# Patient Record
Sex: Male | Born: 1950
Health system: Southern US, Community
[De-identification: ages and names within clinical notes are randomized; demographics above are authoritative.]

---

## 1998-12-18 ENCOUNTER — Ambulatory Visit (HOSPITAL_COMMUNITY): Admission: RE | Admit: 1998-12-18 | Discharge: 1998-12-18 | Payer: Self-pay | Admitting: Internal Medicine

## 1999-01-06 ENCOUNTER — Ambulatory Visit (HOSPITAL_COMMUNITY): Admission: RE | Admit: 1999-01-06 | Discharge: 1999-01-06 | Payer: Self-pay | Admitting: Internal Medicine

## 2001-01-23 ENCOUNTER — Encounter: Payer: Self-pay | Admitting: Orthopedic Surgery

## 2001-01-23 ENCOUNTER — Ambulatory Visit (HOSPITAL_COMMUNITY): Admission: RE | Admit: 2001-01-23 | Discharge: 2001-01-23 | Payer: Self-pay | Admitting: Orthopedic Surgery

## 2001-01-26 ENCOUNTER — Ambulatory Visit (HOSPITAL_BASED_OUTPATIENT_CLINIC_OR_DEPARTMENT_OTHER): Admission: RE | Admit: 2001-01-26 | Discharge: 2001-01-26 | Payer: Self-pay | Admitting: Orthopedic Surgery

## 2002-05-07 ENCOUNTER — Encounter: Admission: RE | Admit: 2002-05-07 | Discharge: 2002-08-05 | Payer: Self-pay | Admitting: Internal Medicine

## 2003-05-14 ENCOUNTER — Encounter: Payer: Self-pay | Admitting: Neurosurgery

## 2003-05-14 ENCOUNTER — Ambulatory Visit (HOSPITAL_COMMUNITY): Admission: RE | Admit: 2003-05-14 | Discharge: 2003-05-14 | Payer: Self-pay | Admitting: Neurosurgery

## 2004-12-24 ENCOUNTER — Ambulatory Visit (HOSPITAL_COMMUNITY): Admission: RE | Admit: 2004-12-24 | Discharge: 2004-12-24 | Payer: Self-pay | Admitting: Gastroenterology

## 2011-06-21 ENCOUNTER — Other Ambulatory Visit: Payer: Self-pay | Admitting: Occupational Medicine

## 2011-06-21 ENCOUNTER — Ambulatory Visit: Payer: Self-pay

## 2011-06-21 DIAGNOSIS — R52 Pain, unspecified: Secondary | ICD-10-CM

## 2012-06-03 IMAGING — CR DG FOOT COMPLETE 3+V*R*
3 series · 3 of 3 positions shown · non-contrast
Comparison: None.

CLINICAL DATA: Pain in the second and third toes after fall
yesterday.

RIGHT FOOT COMPLETE - 3+ VIEW

[view not recorded (1 of 3)]
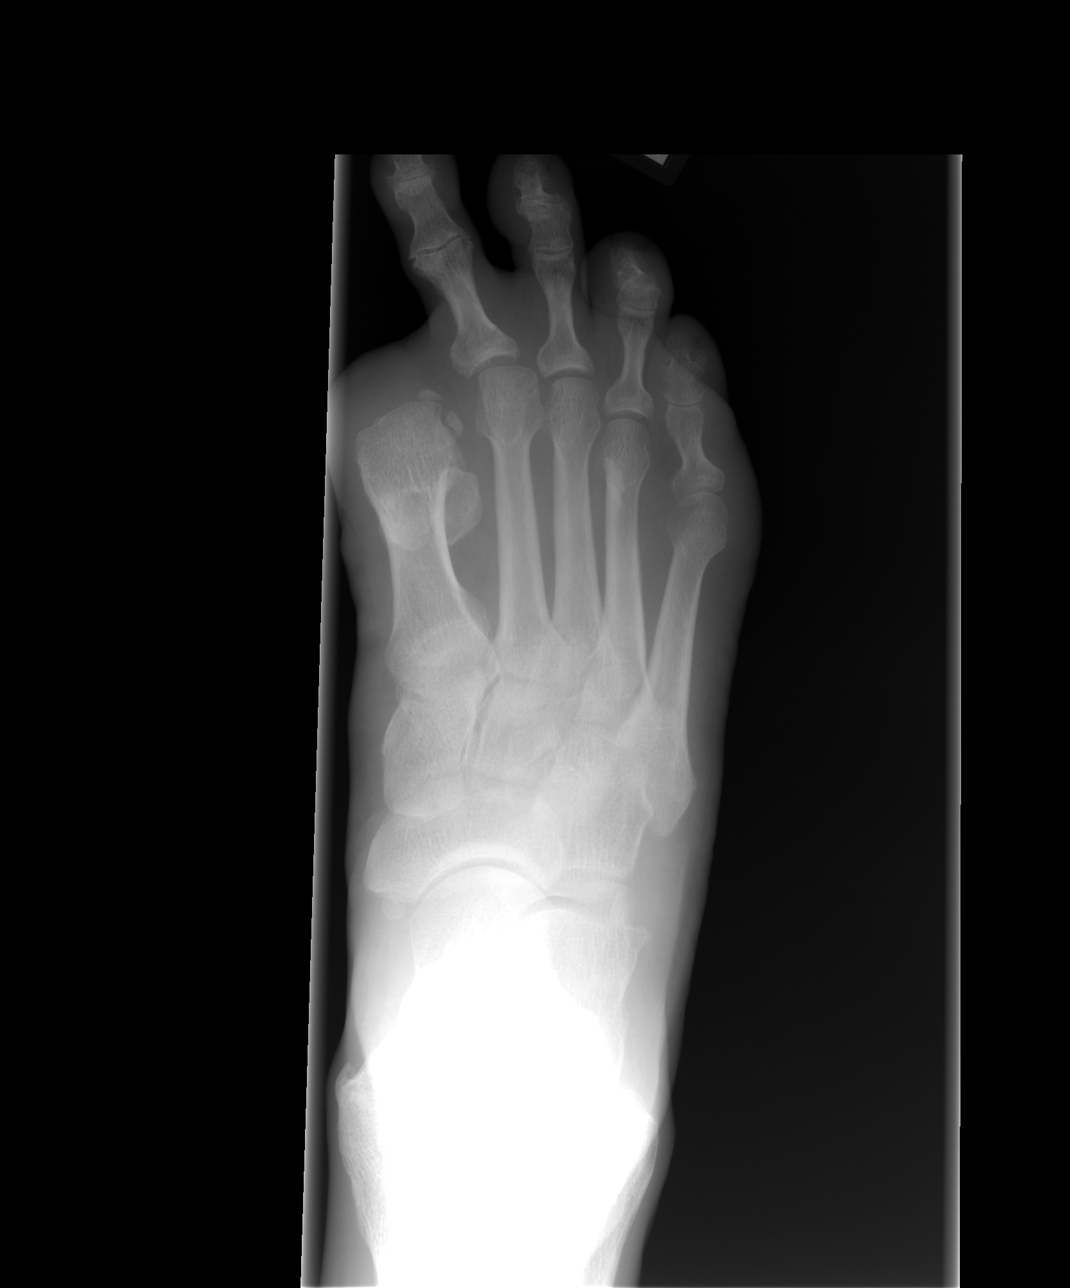

[view not recorded (2 of 3)]
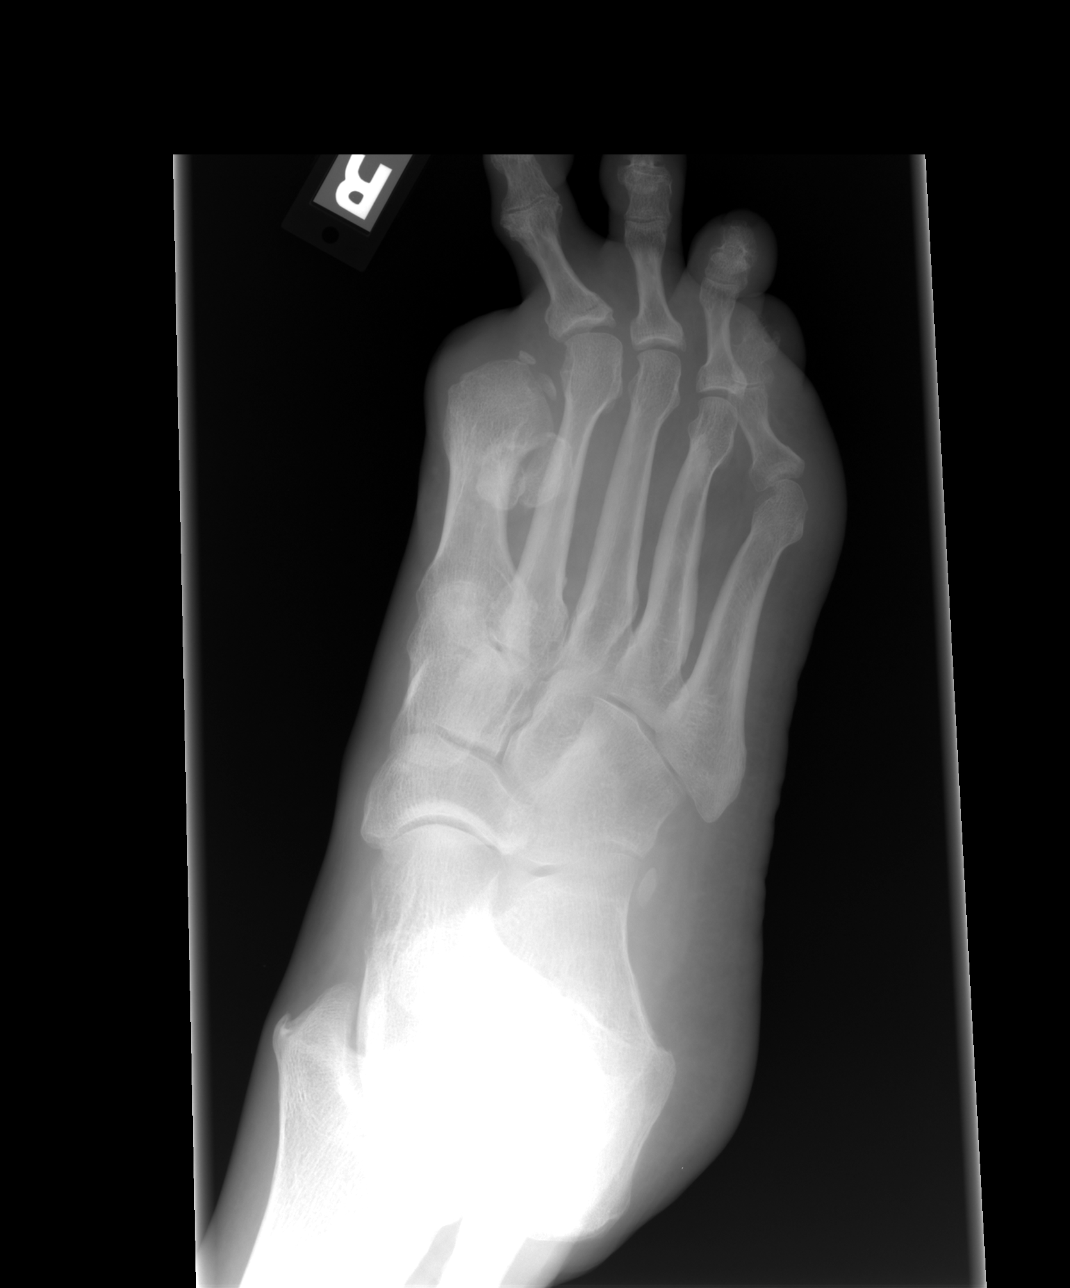

[view not recorded (3 of 3)]
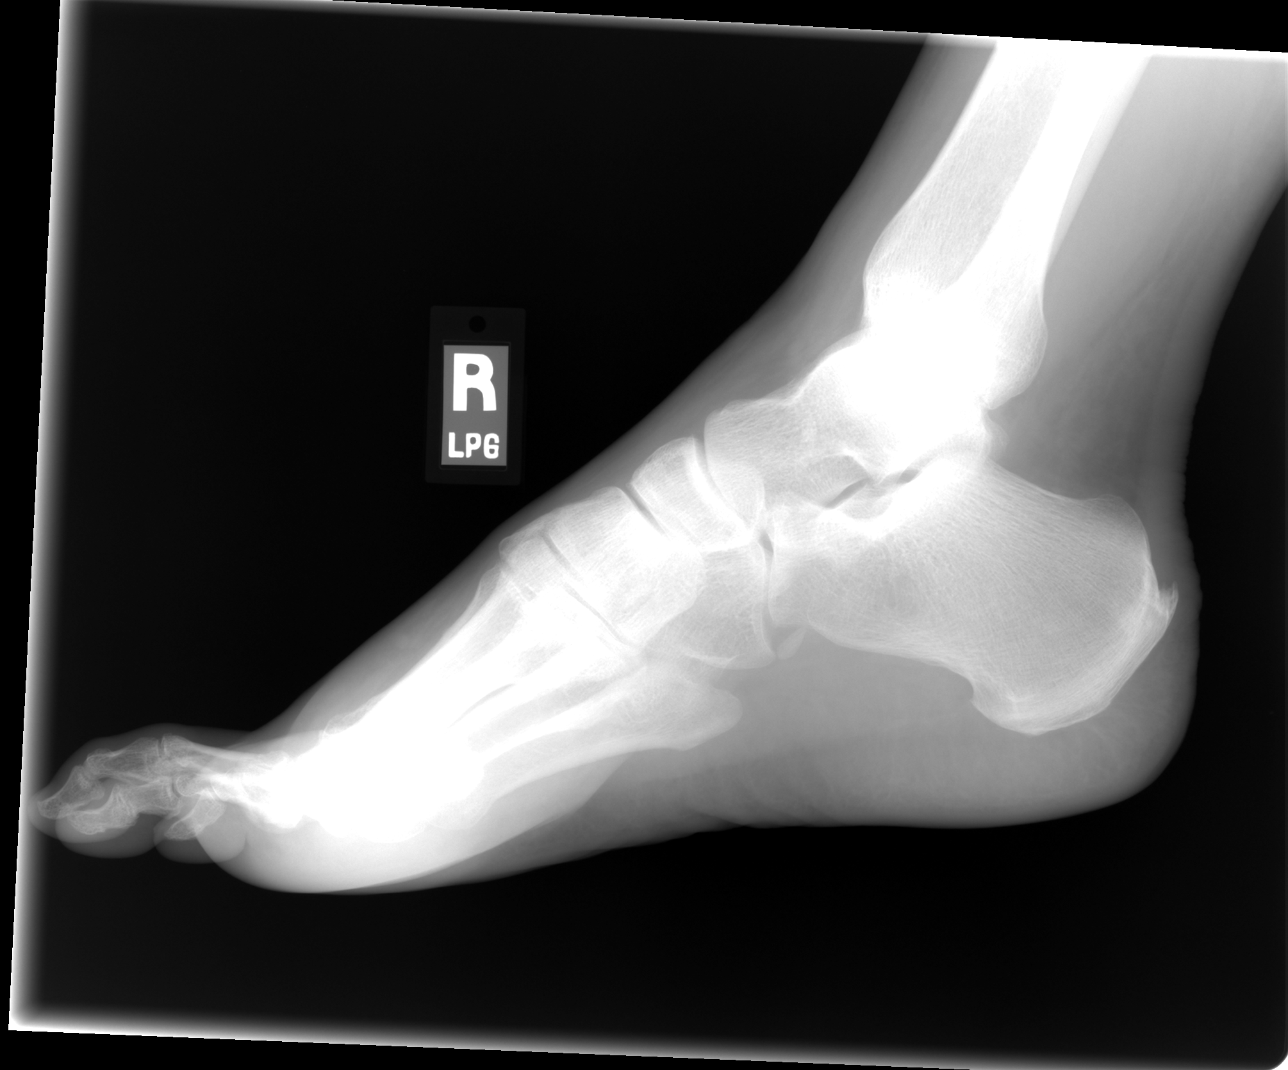

[3 of 3 positions shown; findings below may reference images not displayed]

FINDINGS: There is a nondisplaced fracture to the base of the
proximal phalangeal bone of the second toe.  No other acute
abnormalities.

 Arthritic changes are present in the DIP joints of the toes and in
the PIP joints of the second and third toes.

Right great toe has been amputated.  Irregularity of the head of
the first metatarsal.
IMPRESSION: Nondisplaced fracture of the base of the proximal phalangeal bone
of the second toe.

## 2013-01-02 ENCOUNTER — Ambulatory Visit (INDEPENDENT_AMBULATORY_CARE_PROVIDER_SITE_OTHER): Payer: Self-pay | Admitting: Family Medicine

## 2013-01-02 VITALS — BP 138/80 | Wt 216.0 lb

## 2013-01-02 DIAGNOSIS — E119 Type 2 diabetes mellitus without complications: Secondary | ICD-10-CM

## 2013-01-02 NOTE — Progress Notes (Signed)
Patient presents today for 3 mo DM follow-up as part of the employer-sponsored [employer] program. Current DM regimen includes Hmalog via pump and Metformin. Patient also continues on daily ASA and ACEi. Due for appt with Jacky Kindle in 2 weeks, labwork tomorrow. Patient saw Cathey in December, there were no changes at this time.   Diabetes Assessment: Uses glucometer; 7 day CBG average 177; 14 day CBG average 151; 30 day CBG average 156; Highest CBG 257; Lowest CBG 67 Other Diabetes History: Patient continues testing 1-4 times per day, which is less than optimal for type 1 diabetic. Patient understands that he should be testing at least 6 times daily and recognizes that he needs to work on this. On a positive note, patient's glucose averages have decreased by ~30 points. Most readings run in the 130-170 range. Patient has an occassional hi above 200 and an occassional low below 100. Pt states that his insulin use has increased and he is now calculating insulin requirement and adding a few units to this value. Pt will see Dr. Jacky Kindle in one week and I expect that at this time patient will have basal rates adjusted and potentially carb ratio adjusted.   MD identified a newly forming cataract, will monitor this closely. Will follow-up in 1 year. Cataract had not progressed over the past year.  Lifestyle Factors: Exercise - Continues exercising 3-4 times daily, using cardio-rider and walking the dog.   Diet - Patient is the primary cook and he does admit the need to sometimes prepare more healthy dishes. Wife wants to lose weight so they are working on this together, eating more vegetables and watching portion sizes. Patient is doing better with carb counting but still needs to work on this.   Assessment: We do not have an A1c for today, but it appears pts glucose has improved as all averages are ~30 points lower than previously. Patient continues to do well with exercise but has room for improvement in  diet, carb counting, and glucose monitoring. We have set goals for these things and pt will return in 3 months.  Plan: 1) Continue exercising regularly 2) Continue to make healthy dietary choices 3) Continue being diligent in counting carbs 4) Attempt to increase glucose testing 5) Follow-up in 3 months on Wednesday, May 21st @ 2:30 pm

## 2013-01-22 NOTE — Progress Notes (Signed)
ATTENDING PHYSICIAN NOTE: I have reviewed the chart and agree with the plan as detailed above. Sara Neal MD Pager [phone]  

## 2013-04-03 ENCOUNTER — Ambulatory Visit (INDEPENDENT_AMBULATORY_CARE_PROVIDER_SITE_OTHER): Payer: Self-pay | Admitting: Family Medicine

## 2013-04-03 VITALS — BP 128/74 | Wt 216.0 lb

## 2013-04-03 DIAGNOSIS — E119 Type 2 diabetes mellitus without complications: Secondary | ICD-10-CM

## 2013-04-03 NOTE — Progress Notes (Signed)
Patient presents today for 3 month diabetes follow-up as part of the employer-sponsored [employer] program. Current diabetes regimen includes Humalog via pump and Metformin. Patient also continues on daily ASA, and ACEi. Most recent MD follow-up was this past April with Edison Pace, CDE. Previous to that patient was seen by Dr. Jacky Kindle in March. Next follow-up will be in July. No med changes or major health changes at this time.   Diabetes Assessment: Type of Diabetes: Type 2; Sees Diabetes provider 4 or more times per year; MD managing Diabetes Edison Pace, CDE, Geoffry Paradise, Endo; takes medications as prescribed; takes an aspirin a day; hypoglycemia frequency Rare; checks feet daily; Lowest CBG 69; Highest CBG 291,188; checks blood glucose 3-4 times a day Other Diabetes History: Current med regimen includes Humalog via pump and Metformin 1000 mg twice daily. Dr. Jacky Kindle recently adjusted insulin pump settings and patient is pleased with improved control after these adjustments. Patient does maintain good medication compliance. Patient did bring meter today and is currently testing 1-4 times per day. Glucose monitoring occurs fasting/pre-prandial/post-prandial/bedtime/when symptomatic. Hypoglycemia frequency is rare. Patient does demonstrate appropriate correction of hypoglycemia. Patient reports continued symptoms of neuropathy including tingling in toes and sometimes lower extremeties. Patient is able to tell by neuropathy symptoms if glucose is being well controlled or not. Patient is not due for yearly eye exam.  Lifestyle Factors: Exercise - Continues exercising 3-4 times daily, walking the dog and working in the yard. He is no longer using the cardio rider due to lack of time. Pt owns a vacation home in [place] and will be spending a lot of time there. He and his wife are able to stay active while on vacation and enjoy walking their neighborhood and beach and boating.  Diet - Patient  attempts to maintain a healthy diet. He continues watching portion sizes and is attempting to eat more vegetables and non-starchy foods. Patient continues carb counting prior to each meal.   Assessment: We do not have an A1c for today, but it appears pts glucose has improved as glucose range is tighter than previously has been. Patient continues to do well with exercise but has room for improvement in diet. Patient is very aware of the need for tight diabetes control in order to prevent complications. He will attempt to maintain healthy lifestyle and will follow-up in 3 months.   Plan: 1) Continue to maintain a healthy diet limiting portion sizes and making healthy choices 2) Continue to remain physically active 3) Continue testing regularly 4) Follow-up in 3 months on Tuesday August 26th @ 2:30 pm

## 2013-05-06 NOTE — Progress Notes (Signed)
Patient ID: Nathan Ortiz, male   DOB: Dec 11, 1950, 62 y.o.   MRN: [id number] ATTENDING PHYSICIAN NOTE: I have reviewed the chart and agree with the plan as detailed above. Denny Levy MD Pager [phone]

## 2013-07-09 ENCOUNTER — Ambulatory Visit (INDEPENDENT_AMBULATORY_CARE_PROVIDER_SITE_OTHER): Payer: 59 | Admitting: Family Medicine

## 2013-07-09 VITALS — BP 160/82 | Wt 219.0 lb

## 2013-07-09 DIAGNOSIS — E119 Type 2 diabetes mellitus without complications: Secondary | ICD-10-CM

## 2013-07-09 NOTE — Progress Notes (Signed)
Patient presents today for 3 month diabetes follow-up as part of the employer-sponsored [employer] to [employer] program. Current diabetes regimen includes Humalog and Metformin. Patient also continues on daily ASA and ACEi. Most recent MD follow-up was with Dr. Jacky Kindle this past July. Patient has a pending appt for tomorrow for follow-up with CDE, Edison Pace. No med changes or major health changes at this time.   Diabetes Assessment: Type of Diabetes: Type 2; Sees Diabetes provider 4 or more times per year; MD managing Diabetes Edison Pace, CDE, Geoffry Paradise, Endo; takes medications as prescribed; takes an aspirin a day; hypoglycemia frequency Rare; checks feet daily; uses glucometer; checks blood glucose 3-4 times a day; 30 day CBG average 165; 14 day CBG average 167; 7 day CBG average 173; Highest CBG 247; Lowest CBG 89; Other Diabetes History: Current med regimen includes Humalog via pump and Metformin 1000 mg twice daily. Dr. Jacky Kindle recently adjusted insulin pump settings and patient is pleased with improved control after these adjustments. Patient does maintain good medication compliance and uses about 48-50 units daily. Patient did bring meter today and is currently testing 1-4 times per day. Glucose monitoring occurs fasting/pre-prandial/post-prandial/bedtime/when symptomatic. Hypoglycemia frequency is rare. Patient does demonstrate appropriate correction of hypoglycemia. Patient reports continued symptoms of neuropathy including tingling in toes and sometimes lower extremeties, but reports this is stable and has not worsened. Patient is not due for yearly eye exam.  Lifestyle Assessment: Exercise - Continues exercising 3-4 times per week, walking the dog and working in the yard. He is no longer using the cardio rider. Pt owns a vacation home in [place] and has spent a lot of time there this summer. He and his wife are able to stay active while on vacation and enjoy walking their neighborhood and  beach and boating.  Diet - No major changes to diet. Patient attempts to maintain a healthy diet. He continues watching portion sizes and is attempting to eat more vegetables and non-starchy foods. Patient continues carb counting prior to each meal.   Assessment: We do not have an A1c for today, but it appears pts glucose has improved as glucose range is tighter than previously has been. Patient continues to do well with exercise but has room for improvement in diet. Patient is very aware of the need for tight diabetes control in order to prevent complications. He will attempt to maintain healthy lifestyle and will follow-up in 3 months.   Plan: 1) Continue testing regularly 2) Continue making healthy dietary choices and being diligent about carb counting 3) Continue to stay physically active, and attempt to increase this as weather improves throughout fall 4) Follow-up in 3 months on Tuesday November 25th @ 2:30 pm

## 2013-09-19 ENCOUNTER — Other Ambulatory Visit: Payer: Self-pay

## 2013-10-08 ENCOUNTER — Ambulatory Visit (INDEPENDENT_AMBULATORY_CARE_PROVIDER_SITE_OTHER): Payer: 59 | Admitting: Family Medicine

## 2013-10-08 VITALS — BP 142/72 | Wt 218.0 lb

## 2013-10-08 DIAGNOSIS — E119 Type 2 diabetes mellitus without complications: Secondary | ICD-10-CM

## 2013-10-08 NOTE — Progress Notes (Signed)
Nathan Ortiz presents today for 3 month diabetes follow-up as part of the employer-sponsored [employer] program. Current diabetes regimen includes Humalog via pump and Metformin. Nathan Ortiz also continues on daily ASA, ACEi, and statin. Most recent MD follow-up with Dr. Jacky Kindle was late October 2014. Nathan Ortiz has a pending appt with Cathey CDE in early December and Dr. Jacky Kindle in February. No med changes or major health changes at this time.   Diabetes Assessment: Type of Diabetes: Type 2; Sees Diabetes provider 4 or more times per year; MD managing Diabetes Edison Pace, CDE, Geoffry Paradise, Endo; takes medications as prescribed; takes an aspirin a day; hypoglycemia frequency Rare; checks feet daily; uses glucometer; checks blood glucose 3-4 times a day; 30 day CBG average 165; 14 day CBG average 167; 7 day CBG average 173; Lowest CBG 69; Highest CBG 289; Other Diabetes History: Current med regimen includes Humalog via pump and Metformin 1000 mg twice daily. Dr. Jacky Kindle recently adjusted insulin pump settings and Nathan Ortiz is pleased with improved control after these adjustments. Early AM basal rate was increased to 2.8 units per hour. Nathan Ortiz does maintain good medication compliance and uses about 50 units daily, sometimes more. Nathan Ortiz did bring meter today and is currently testing 1-4 times per day. Uses two meters now, so readings are spread out among those two meters. One he uses as primary and the other his wife keeps in her purse for use while they are out. Glucose monitoring occurs fasting/pre-prandial/post-prandial/bedtime/when symptomatic. Hypoglycemia frequency is rare. Nathan Ortiz did have one reading as low as 69 this month. Nathan Ortiz does demonstrate appropriate correction of hypoglycemia. Nathan Ortiz reports continued symptoms of neuropathy including tingling in toes and sometimes lower extremeties, but reports this is stable and has not worsened. Nathan Ortiz is not due for yearly eye exam.  Lifestyle  Factors: Exercise - Continues exercising 3-4 times per week, walking the dog and working in the yard. He is no longer using the cardio rider. Pt owns a vacation home in [place] and when there he does spend a lot of time walking and working on home improvements. Nathan Ortiz now has function on cell phone to track distance walked and heart rate, etc. Tracks between 8000-9000 steps per day. Nathan Ortiz is aware of 10,000 step/day goal.  Diet - No major changes to diet. Nathan Ortiz attempts to maintain a healthy diet. He continues watching portion sizes and is attempting to eat more vegetables and non-starchy foods. Nathan Ortiz continues carb counting prior to each meal. Patients wife is attempting to lose weight and they both are following somewhat of a weight watcher diet, although they are not counting points. Instead they are focusing on dietary choices and portion sizes.  Assessment: Nathan Ortiz presents today with a slightly improved A1c of 7.2 (vs 7.4). Nathan Ortiz continues to do well with diet and exercise but has room for improvement. Nathan Ortiz is very aware of the need for tight diabetes control in order to prevent complications. He will attempt to maintain healthy lifestyle throughout the holidays and will follow-up in 3 months.   Plan: 1) Continue to maintain healthy diet and portion sizes during the holidays 2) Continue exercising and attmept to reach 10,000 steps per day 3) Continue testing regularly 4) Follow-up in 3 months on Tuesday March 3rd @ 2:30 pm

## 2013-11-25 NOTE — Progress Notes (Signed)
Patient ID: Nathan Ortiz, male   DOB: 10/06/1951, 62 y.o.   MRN: [id number] ATTENDING PHYSICIAN NOTE: I have reviewed the chart and agree with the plan as detailed above. Sara Neal MD Pager [phone]  

## 2013-11-25 NOTE — Progress Notes (Signed)
Patient ID: Ledon SnareMatthew J Fillion, male   DOB: Feb 09, 1951, 63 y.o.   MRN: [id number] ATTENDING PHYSICIAN NOTE: I have reviewed the chart and agree with the plan as detailed above. Denny LevySara Rye Dorado MD Pager [phone]

## 2014-01-22 ENCOUNTER — Ambulatory Visit (INDEPENDENT_AMBULATORY_CARE_PROVIDER_SITE_OTHER): Payer: 59 | Admitting: Family Medicine

## 2014-01-22 VITALS — BP 104/62 | Wt 216.0 lb

## 2014-01-22 DIAGNOSIS — E119 Type 2 diabetes mellitus without complications: Secondary | ICD-10-CM

## 2014-01-22 NOTE — Progress Notes (Signed)
Patient presents today for 3 month diabetes follow-up as part of the employer-sponsored [employer] to [employer] program. Current diabetes regimen includes Humalog and Xigduo (dapagliflozin and metformin). Patient also continues on daily ASA and ACEi. Most recent MD follow-up was last week, March 2015 with [name]. Patient has a pending appt for May 2015 with Cathey, CDE. No med changes or major health changes at this time. A1c 7.2 at last appt.   Diabetes Assessment: Type of Diabetes: Type 2; Sees Diabetes provider 4 or more times per year; MD managing Diabetes [name], CDE, Geoffry Paradiseichard Aronson, Endo; takes medications as prescribed; takes an aspirin a day; hypoglycemia frequency Rare; checks feet daily; uses glucometer; checks blood glucose 3-4 times a day; 7 day CBG average 177; 14 day CBG average 170; 30 day CBG average 160; Highest CBG 252; Lowest CBG 70; A1c 7.2 Other Diabetes History: Current med regimen includes Humalog via pump and Xigduo 5/1,000 mg twice daily (dapagliflozin and metformin). Patient was started on xigduo in December. Thus far, he has not had any improvement in A1c. Patient is tolerating new med well but does report some diuresis. No pump adjustments at last visit with [name]. Patient does maintain good medication compliance and uses about 50 units daily, sometimes more. Patient did bring meter today and is currently testing 1-4 times per day. Continues using two meters, so readings are spread out among those two meters. One he uses as primary and the other his wife keeps in her purse for use while they are out. Glucose monitoring occurs fasting/pre-prandial/post-prandial/bedtime/when symptomatic. Hypoglycemia frequency is rare. Patient does demonstrate appropriate correction of hypoglycemia. Patient reports continued symptoms of neuropathy including tingling in toes and sometimes lower extremeties, but reports this is stable and has not worsened. Patient had yearly eye exam in  January revealing small cataracts (no action needed) and negative for retinopathy. Of note, patient is missing his great toe of right foot due to injury approxmately 35 years ago. He reports a neuroma on this joint which sometimes causes discomfort.   Lifestyle Factors: Exercise - Patient cotninues wearing pedometer and is logging 8000-9000 steps per day. He has not been able to acheive goal of 10,000 steps per day. I have encouraged him to walk in the evenings in order to reach goal. Pt owns a vacation home in [place] and when there he does spend a lot of time walking and working on home improvements.  Diet - No major changes to diet. Patient attempts to maintain a healthy diet. He continues watching portion sizes and is attempting to eat more vegetables and non-starchy foods. Patient continues carb counting prior to each meal. Patients wife is attempting to lose weight and they both are following somewhat of a weight watcher diet, although they are not counting points. Instead they are focusing on dietary choices and portion sizes.   Assessment: Patient presents today with no change in A1c at 7.2. Patient continues to do well with diet and exercise but has room for improvement. Patient is very aware of the need for tight diabetes control in order to prevent complications. He will attempt to maintain healthy lifestyle and will follow-up in 3 months.   Plan: 1) Continue making healthy dietary choices and watching portion sizes 2) Continue walking and staying as active as possible. Goal 10,000 steps per day 3) Continue testing regularly 4) Follow-up in 3 months on Wednesday June 10th @ 2:30

## 2014-04-23 ENCOUNTER — Ambulatory Visit (INDEPENDENT_AMBULATORY_CARE_PROVIDER_SITE_OTHER): Payer: 59 | Admitting: Family Medicine

## 2014-04-23 VITALS — BP 130/62 | Wt 215.0 lb

## 2014-04-23 DIAGNOSIS — E119 Type 2 diabetes mellitus without complications: Secondary | ICD-10-CM

## 2014-04-25 NOTE — Progress Notes (Signed)
Patient presents today for 3 month diabetes follow-up as part of the employer-sponsored [employer] to [employer] program. Current diabetes regimen includes Xigduo (dapagliflozin and metformin) and humalog via pump. Patient also continues on daily ASA and ACEi. Most recent MD follow-up was with [name] CDE in May 2015. Patient has a pending appt for July with [name], endo. He continues alternating between endo and CDE. No med changes or major health changes at this time.  Diabetes Assessment: Type of Diabetes: Type 2; Sees Diabetes provider 4 or more times per year; MD managing Diabetes [name], CDE, Geoffry Paradiseichard Aronson, Endo; takes medications as prescribed; takes an aspirin a day; hypoglycemia frequency Rare; checks feet daily; uses glucometer; checks blood glucose 3-4 times a day; 30 day CBG average 132; 14 day CBG average 139; 7 day CBG average 134; Highest CBG 291; Lowest CBG 76; Other Diabetes History: Current med regimen includes Humalog via pump and Xigduo 5/1,000 mg twice daily (dapagliflozin and metformin). Pump rates are being adjusted as needed by [name] and [name]. Patient does maintain good medication compliance and uses about 50 units daily, sometimes more. Patient did bring meter today and is currently testing 1-4 times per day, testing has increased since last visit. Continues using two meters, so readings are spread out among those two meters. One he uses as primary and the other his wife keeps in her purse for use while they are out. Glucose monitoring occurs fasting/pre-prandial/post-prandial/bedtime/when symptomatic. Hypoglycemia frequency is rare. Glucose averages for 7, 14, and 30 days are all down by 30-40 points. Patient does demonstrate appropriate correction of hypoglycemia. Patient reports continued symptoms of neuropathy including tingling in toes and sometimes lower extremeties, but reports this is stable and has not worsened. Patient had yearly eye exam in January  revealing small cataracts (no action needed) and negative for retinopathy. Of note, patient is missing his great toe of right foot due to injury approxmately 35 years ago. He reports a neuroma on this joint which sometimes causes discomfort. A1c at most recent MD appt (May) had decreased significantly to 6.6 (previously 7.2).   Lifestyle Factors: Exercise - Patient continues using a pedometer function on phone, he is now tracking minutes walked vs steps walked. He has a goal of walking 60 minutes total per day, and some days he is walking as much as 80-90 min per day. In addition to walking at work, patient and his wife are also doing much yard work this summer. They also plan to use their vacation home several times this summer and are often more active there.  Diet - No major changes to diet. Patient attempts to maintain a healthy diet. He continues watching portion sizes and is attempting to eat more vegetables and non-starchy foods. Patient continues carb counting prior to each meal. Patients wife continues to work on weight loss and they both are trying to make better food choices.  Assessment: Patient presents today with improved A1cof 6.6 (prev 7.2). Patient continues to do well with diet and exercise but has room for improvement. Patient is very aware of the need for tight diabetes control in order to prevent complications. He will attempt to maintain healthy lifestyle and will follow-up in 3 months.  Plan: 1) Continue making healthy dietary choices 2) Continue to stay active 3) Continue to test glucose regularly 4) Follow-up in 3 months on Wednesday Sept 9th @ 2:30 pm

## 2014-06-17 NOTE — Progress Notes (Signed)
Patient ID: Nathan Ortiz, male   DOB: 02/21/1951, 62 y.o.   MRN: [id number] Reviewed: Agree with our pharmacologist's documentation and management. 

## 2014-06-17 NOTE — Progress Notes (Signed)
Patient ID: Nathan SnareMatthew J Sorber, male   DOB: May 25, 1951, 63 y.o.   MRN: [id number] Reviewed: Agree with our pharmacologist's documentation and management.

## 2014-06-18 ENCOUNTER — Encounter (HOSPITAL_BASED_OUTPATIENT_CLINIC_OR_DEPARTMENT_OTHER): Payer: 59 | Attending: General Surgery

## 2014-06-18 DIAGNOSIS — I10 Essential (primary) hypertension: Secondary | ICD-10-CM | POA: Diagnosis not present

## 2014-06-18 DIAGNOSIS — E1169 Type 2 diabetes mellitus with other specified complication: Secondary | ICD-10-CM | POA: Diagnosis present

## 2014-06-18 DIAGNOSIS — S98119A Complete traumatic amputation of unspecified great toe, initial encounter: Secondary | ICD-10-CM | POA: Insufficient documentation

## 2014-06-18 DIAGNOSIS — L97509 Non-pressure chronic ulcer of other part of unspecified foot with unspecified severity: Secondary | ICD-10-CM | POA: Diagnosis not present

## 2014-06-18 DIAGNOSIS — Z794 Long term (current) use of insulin: Secondary | ICD-10-CM | POA: Diagnosis not present

## 2014-06-18 NOTE — Progress Notes (Signed)
[facility]  NAME:  Sharol GivenWIERSMA, Demetre             ACCOUNT NO.:  [id number]  MEDICAL RECORD NO.:  [id number]      DATE OF BIRTH:  12/11/1950  PHYSICIAN:  Ardath SaxPeter Alphons Burgert, M.D.      VISIT DATE:  06/18/2014                                  OFFICE VISIT   This is a 63 year old gentleman who is a diabetic who came to us with 2 diabetic ulcers on his left great toe and left third toe.  This man has a history of hypertension and today his blood pressure is 129/67, pulse 92, temperature 99.  He weighs 215 pounds.  His medications for his diabetes and his hypertension are lisinopril 12.5 mg a day.  He takes Humalog 57 units a day.  He is also on aspirin and vitamins.  He also takes metformin.  He had a amputation of his right great toe in the past and these ulcers on his left foot are only 2 or 3 mm in diameter.  I am going to treat them with felt to offload and silver collagen.  He will come back here in a week, and we will check his progress.     Ardath SaxPeter Arwen Haseley, M.D.     PP/MEDQ  D:  06/18/2014  T:  06/18/2014  Job:  [id number]

## 2014-06-25 DIAGNOSIS — E1169 Type 2 diabetes mellitus with other specified complication: Secondary | ICD-10-CM | POA: Diagnosis not present

## 2014-07-02 DIAGNOSIS — E1169 Type 2 diabetes mellitus with other specified complication: Secondary | ICD-10-CM | POA: Diagnosis not present

## 2014-07-09 DIAGNOSIS — E1169 Type 2 diabetes mellitus with other specified complication: Secondary | ICD-10-CM | POA: Diagnosis not present

## 2014-07-16 ENCOUNTER — Encounter (HOSPITAL_BASED_OUTPATIENT_CLINIC_OR_DEPARTMENT_OTHER): Payer: 59 | Attending: General Surgery

## 2014-07-16 DIAGNOSIS — L97509 Non-pressure chronic ulcer of other part of unspecified foot with unspecified severity: Secondary | ICD-10-CM | POA: Insufficient documentation

## 2014-07-16 DIAGNOSIS — E1169 Type 2 diabetes mellitus with other specified complication: Secondary | ICD-10-CM | POA: Insufficient documentation

## 2014-07-23 ENCOUNTER — Ambulatory Visit (INDEPENDENT_AMBULATORY_CARE_PROVIDER_SITE_OTHER): Payer: Self-pay | Admitting: Family Medicine

## 2014-07-23 VITALS — BP 142/74 | Wt 215.0 lb

## 2014-07-23 DIAGNOSIS — E108 Type 1 diabetes mellitus with unspecified complications: Secondary | ICD-10-CM

## 2014-07-29 NOTE — Progress Notes (Signed)
Patient presents today for 3 month diabetes follow-up as part of the employer-sponsored [employer] to [facility] program. Current diabetes regimen includes Xigduo (dapagliflozin and metformin) and humalog via pump. Patient also continues on daily ASA and ACEi. Most recent MD follow-up was with Edison Pace CDE in Aug 2015. Patient will follow-up in 3 months. He continues alternating between endo and CDE. No med changes or major health changes at this time.  Diabetes Assessment: Type of Diabetes: Type 2; Sees Diabetes provider 4 or more times per year; MD managing Diabetes Edison Pace, CDE, Geoffry Paradise, Endo; takes medications as prescribed; takes an aspirin a day; hypoglycemia frequency Rare; checks feet daily; uses glucometer; checks blood glucose 3-4 times a day; 7 day CBG average 136; 14 day CBG average 134; 30 day CBG average 123; Lowest CBG 69; Highest CBG 174; Other Diabetes History: Current med regimen includes Humalog via pump and Xigduo 5/1,000 mg twice daily (dapagliflozin and metformin). Pump rates are being adjusted as needed by Dr. Jacky Kindle and Edison Pace. Patient does maintain good medication compliance and uses about 50 units daily, sometimes more. Patient did bring meter today and is currently testing 1-4 times per day. Continues using two meters, so readings are spread out among those two meters. Glucose monitoring occurs fasting/pre-prandial/post-prandial/bedtime/when symptomatic. Hypoglycemia frequency is rare. Glucose averages for 7, 14, and 30 days have improved. Patient does demonstrate appropriate correction of hypoglycemia. Patient reports continued symptoms of neuropathy including tingling in toes and sometimes lower extremeties, but reports this is stable and has not worsened. Patient recently had a slow healing foot injury that was treated by wound care and has now finally healed. The wound never became infected but was just slow healing. Wound care eventually patched the small wound  with a skin graft and this closed the wound completely. Patient had yearly eye exam in January revealing small cataracts (no action needed) and negative for retinopathy. Of note, patient is missing his great toe of right foot due to injury approxmately 35 years ago. He reports a neuroma on this joint which sometimes causes discomfort. A1c at most recent MD appt (May) had decreased to 6.2 (previously 6.6).   Lifestyle Factors: Exercise - Patient continues using a pedometer function on phone, he is now tracking minutes walked vs steps walked. He has a goal of walking 60 minutes total per day, and some days he is walking as much as 80-90 min per day. In addition to walking at work, patient continues with regular yard work. They also have a 2 week trip to [place] planned at which time they will do more walking.  Diet - No major changes to diet. Trying to eat more fish lately as a healthier option. Patient attempts to maintain a healthy diet. He continues watching portion sizes and is attempting to eat more vegetables and non-starchy foods. Patient continues carb counting prior to each meal. Patients wife continues to work on weight loss and they both are trying to make better food choices.  Assessment: Patient presents today with improved A1cof 6.2 (prev 6.6). Patient continues to do well with diet and exercise but has room for improvement. Patient is very aware of the need for tight diabetes control in order to prevent complications. He will attempt to maintain healthy lifestyle and will follow-up in 3 months.  Plan: 1) Continue making healthy dietary choices 2) Continue to stay active, increase as able 3) Continue to test glucose regularly 4) Follow-up in 3 months - call to schedule appt

## 2014-10-01 NOTE — Progress Notes (Signed)
Patient ID: Nathan SnareMatthew J Zurcher, male   DOB: Mar 12, 1951, 63 y.o.   MRN: [id number] Reviewed: Agree with the documentation and management of [facility].

## 2014-10-02 ENCOUNTER — Ambulatory Visit (INDEPENDENT_AMBULATORY_CARE_PROVIDER_SITE_OTHER): Payer: Self-pay | Admitting: Family Medicine

## 2014-10-02 VITALS — BP 132/74 | Wt 215.0 lb

## 2014-10-02 DIAGNOSIS — E108 Type 1 diabetes mellitus with unspecified complications: Secondary | ICD-10-CM

## 2014-10-02 NOTE — Progress Notes (Signed)
Patient presents today for 3 month diabetes follow-up as part of the employer-sponsored [employer] to Well[facility] program. Current diabetes regimen includes Xigduo (dapagliflozin and metformin) and humalog via pump. Patient also continues on daily ASA and ACEi. Most recent MD follow-up was with [name] in Nov 2015. Patient will follow-up in 3 months. He continues alternating between endo and CDE. No med changes or major health changes at this time.  Diabetes Assessment: Type of Diabetes: Type 1; Sees Diabetes provider 4 or more times per year; MD managing Diabetes [name], CDE, Geoffry Paradiseichard Aronson, Endo; takes medications as prescribed; takes an aspirin a day; hypoglycemia frequency Rare; checks feet daily; uses glucometer; checks blood glucose 3-4 times a day; 30 day CBG average 132; 14 day CBG average 119; 7 day CBG average 114; Highest CBG 196; Lowest CBG 75 Other Diabetes History: Current med regimen includes Humalog via pump and Xigduo 5/1,000 mg twice daily (dapagliflozin and metformin). Pump rates are being adjusted as needed by [name] and [name]. Of note, patient has a new prescription on hold for Invokana plus Metformin to use in place of Kombiglyze if it is no longer covered in January. Patient does maintain good medication compliance and uses about 50 units daily, sometimes more. Patient did bring meter today and is currently testing 1-4 times per day. Continues using two meters, so readings are spread out among those two meters. Glucose monitoring occurs fasting/pre-prandial/post-prandial/bedtime/when symptomatic. Hypoglycemia frequency is rare. Glucose averages for 7 and 14 days have both improved, and 30 days have increased slightly. Patient does demonstrate appropriate correction of hypoglycemia. Patient reports continued symptoms of neuropathy including tingling in toes and sometimes lower extremeties, but reports this is stable and has not worsened. Patient recently had a slow healing  foot injury that was treated by wound care and continues to be healed, he can no longer tell where the wound was previously. Patient had yearly eye exam in January revealing small cataracts (no action needed) and negative for retinopathy. Of note, patient is missing his great toe of right foot due to injury approxmately 35 years ago. He reports a neuroma on this joint which sometimes causes discomfort. A1c at most recent MD appt (Nov) had increased to 6.3 (previously 6.2).   Lifestyle Factors: Exercise - Patient continues using a pedometer function on phone, he is now tracking minutes walked vs steps walked. He has a goal of walking 60 minutes total per day, and some days he is walking as much as 80-90 min per day. In addition to walking at work, patient continues with regular yard work. [place] also travel to [place] frequently and are active there, walking, fishing, kayaking, etc.  Diet - No major changes to diet. Trying to eat more fish lately as a healthier option. Patient attempts to maintain a healthy diet. He continues watching portion sizes and is attempting to eat more vegetables and non-starchy foods. Patient continues carb counting prior to each meal. Patients wife continues to work on weight loss and they both are trying to make better food choices.   Plan: 1) Continue making healthy dietary choices, specifically during the holidays 2) Continue to stay active, increase as able 3) Continue to test glucose regularly 4) Follow-up in 3 months on Thursday Feb 18th @ 2:30 pm after Cathy's appt

## 2014-11-03 NOTE — Progress Notes (Signed)
Patient ID: Lonza J Meine, male   DOB: 04/05/1951, 63 y.o.   MRN: [id number] Reviewed: Agree with the documentation and management of our [place] Pharmacologist. 

## 2015-01-07 ENCOUNTER — Ambulatory Visit (INDEPENDENT_AMBULATORY_CARE_PROVIDER_SITE_OTHER): Payer: Self-pay | Admitting: Family Medicine

## 2015-01-07 VITALS — BP 122/66 | Ht 74.0 in | Wt 209.0 lb

## 2015-01-07 DIAGNOSIS — E108 Type 1 diabetes mellitus with unspecified complications: Secondary | ICD-10-CM

## 2015-01-07 NOTE — Progress Notes (Signed)
Patient presents today for 3 month diabetes follow-up as part of the employer-sponsored [employer] to Well[facility] program. Current diabetes regimen includes Xigduo (dapagliflozin and metformin) and humalog via pump. Patient also continues on daily ASA and ACEi. Most recent MD follow-up was with [name], CDE in Feb 2016. Patient will follow-up in 3 months, late May 2016. He continues alternating between endo and CDE. Patient has been given samples of Levitra and is trying this as he does not feel Cialis is as efficacious as it once was. A1c at recent visit was 6.5 (prev 6.2).   Diabetes Assessment: Type of Diabetes: Type 2; Sees Diabetes provider 4 or more times per year; MD managing Diabetes [name], CDE, Geoffry Paradiseichard Aronson, Endo; takes medications as prescribed; takes an aspirin a day; hypoglycemia frequency Rare; checks feet daily; uses glucometer; checks blood glucose 3-4 times a day; Highest CBG 270; Lowest CBG 90; Other Diabetes History: Current med regimen includes Humalog via pump and Xigduo 5/1,000 mg twice daily (dapagliflozin and metformin). Pump rates are being adjusted as needed by [name] and [name]. Of note, patient has a new prescription on hold for Invokana plus Metformin to use in place of Kombiglyze if it is no longer covered. I will investigate this. Patient does maintain good medication compliance and uses about 50 units daily, sometimes more. Patient did bring meter today and is currently testing 1-4 times per day. Continues using two meters, so readings are spread out among those two meters. Glucose monitoring occurs fasting/pre-prandial/post-prandial/bedtime/when symptomatic. Hypoglycemia frequency is rare. Patient does demonstrate appropriate correction of hypoglycemia. Patient reports continued symptoms of neuropathy including tingling in toes and sometimes lower extremeties, but reports this is stable and has not worsened. He notices worsening in these symptoms as glucose  worsens. Patient had yearly eye exam in January, cataracts remain small (no action needed) and remains negative for retinopathy. Of note, patient is missing his great toe of right foot due to injury approxmately 35 years ago. He reports a neuroma on this joint which sometimes causes discomfort. A1c at most recent MD appt (Nov) had increased to 6.5 (previously 6.3).   Lifestyle Factors: Exercise - Patient continues using a pedometer to track steps walked. Patient is tracking ~8000 steps per day on the job. He does not participate in routine physical activity outside of work. He is generally more active during the warmer seasons and does regular yard work and is active at his second [place] in [place].  Diet - No major changes to diet. Patient attempts to maintain a healthy diet. He continues watching portion sizes and is attempting to eat more vegetables and non-starchy foods. Patient continues carb counting prior to each meal. Patients wife continues to work on weight loss and they both are trying to make better food choices. Both are adhering to the "[employer] Lovers diet" which allows normal foods, but in smaller quantities and more frequent meals. Also encourages healthy snacking between meals, in an effort to keep metabolism higher.   Plan: 1) Continue making healthy dietary choices, specifically portion control 2) Continue to stay active, increase activity as able 3) Continue to test glucose regularly 4) Follow-up in 3 months on Wednesday June 1st @ 2:30 pm

## 2015-01-20 NOTE — Progress Notes (Signed)
Patient ID: Cerrone J Chamorro, male   DOB: 11/17/1950, 63 y.o.   MRN: [id number] ATTENDING PHYSICIAN NOTE: I have reviewed the chart and agree with the plan as detailed above. Kierston Plasencia MD Pager [phone]  

## 2015-04-15 ENCOUNTER — Ambulatory Visit: Payer: Self-pay | Admitting: Pharmacist

## 2015-04-15 ENCOUNTER — Ambulatory Visit (INDEPENDENT_AMBULATORY_CARE_PROVIDER_SITE_OTHER): Payer: Self-pay | Admitting: Family Medicine

## 2015-04-15 VITALS — BP 122/70 | Ht 74.0 in | Wt 212.0 lb

## 2015-04-15 DIAGNOSIS — E108 Type 1 diabetes mellitus with unspecified complications: Secondary | ICD-10-CM

## 2015-04-15 NOTE — Progress Notes (Signed)
Accucheck Spirit combo speaks with meter  Highest 272, 303 Lowest 74, 75  No site issues  Eye exam:  Nov 24 2014 Dental exam:  Several years  No wounds at this time, no worsening of neuropathy.  No changes in pump settings at last appt.     Subjective:  Patient is a 64 yo male with type 1/2 diabetes/pre-diabetes who presents today for 3 month follow-up as part of the employer-sponsored [facility] to Wellness program. Current diabetes regimen includes Humalog via pump, Invokana, and Metformin. Patient also continues on daily ASA and ACEi. Patient is not on a statin at this time.  Most recent MD follow-up was yesterday with [name], CDE. Patient has a pending appt for August with [name], and [facility]. No med changes or major health changes at this time.  Diabetes Assessment:  No changes to diabetes regimen at this time.  Patient does maintain good medication compliance. Most recent A1c was 6.9% which is exceeding goal of less than 6.5%.  A1c has been trending upward over the previous 6-9 months with recent A1c readings of 6.2 and 6.5.  Weight has increased by 3 lbs since last visit.  Patient did bring meter today and is currently testing 3-4 times per day. Glucose monitoring occurs fasting, prior to meals and as needed.  Hypoglycemia is occassional and as low as 70s. Patient does demonstrate appropriate correction of hypoglycemia.  Patient reports continued, but not worsened, signs and symptoms of neuropathy including numbness/tingling/burning.  No signs of infection at this time.  Patient is up to date on eye exam but not on dental exam.      Lifestyle Assessment:  Diet - Patient and his wife started Weight Watcher's 2 months ago.  He is not counting points but is eating weight watcher friendly foods including more fresh foods, fruits, and vegetables.  He does not wish to make any additional changes at this time.   Exercise - Pt is on his feet and walking 8 hours  per day on the job.  He also enjoys yardwork on evenings and weekends.  Pt [place] his [place] travel to [place] regularly and enjoy outdoor activities such as canoeing and [place]     Plan and Goals: 1)  Attempt to further reduce A1c to <6.5 2)  Continue to improve diet, monitor portion sizes and continue to make healthy dietary choices 3)  Continue to stay active throughout the summer months 4)  Follow-up in 3 months on Thursday August 15th @ 2:15 pm   Orlin HildingElizabeth Deidrick Rainey, PharmD [facility]  [phone]

## 2015-04-15 NOTE — Patient Instructions (Addendum)
1)  Attempt to further reduce A1c to <6.5 2)  Continue to improve diet, monitor portion sizes and continue to make healthy dietary choices 3)  Continue to stay active throughout the summer months 4)  Follow-up in 3 months on Thursday August 15th @ 2:15 pm  Great to see you today!

## 2015-04-21 NOTE — Progress Notes (Signed)
Patient ID: Nathan SnareMatthew J Ortiz, male   DOB: Jan 19, 1951, 64 y.o.   MRN: [id number] ATTENDING PHYSICIAN NOTE: I have reviewed the chart and agree with the plan as detailed above. Denny LevySara Nayla Dias MD Pager [phone]

## 2015-07-09 ENCOUNTER — Encounter: Payer: Self-pay | Admitting: Pharmacist

## 2015-07-09 ENCOUNTER — Ambulatory Visit: Payer: Self-pay | Admitting: Pharmacist

## 2015-07-09 ENCOUNTER — Ambulatory Visit (INDEPENDENT_AMBULATORY_CARE_PROVIDER_SITE_OTHER): Payer: 59 | Admitting: Family Medicine

## 2015-07-09 VITALS — BP 138/64 | Wt 213.0 lb

## 2015-07-09 DIAGNOSIS — E108 Type 1 diabetes mellitus with unspecified complications: Secondary | ICD-10-CM

## 2015-07-09 NOTE — Patient Instructions (Signed)
1)  Good job with further reduced A1c of 6.8% 2)  Continue to improve diet, monitor portion sizes and continue to make healthy dietary choices 3)  Continue to stay active, attempt to use exercise facility at least once weekly 4)  Follow-up in 3 months on Wednesday November 30th @ 2:15 pm  Great to see you today!

## 2015-07-09 NOTE — Progress Notes (Signed)
Subjective:  Patient is a 64 yo male with type 1 diabetes who presents today for 3 month follow-up as part of the employer-sponsored [employer] to Wellness program. Current diabetes regimen includes Humalog via pump, Invokana, and Metformin. Patient also continues on daily ASA and ACEi. Patient is not on a statin at this time due to intolerance.  Most recent MD follow-up was yesterday with Dr. Jacky Kindle for yearly physical.  Three month follow-up with  Edison Pace, CDE in Nov, will make pump setting changes at this time if needed.  No med changes or major health changes at this time.   Diabetes Assessment:  A1c 6.8 (prev 6.9) Chol - TG-85, TC-147, HDL-31, LDL-99, CHOL/HDL 4.7, LDL/HDL 3.2 TSH normal LFTs - normal SCr-normal  No changes to diabetes regimen at this time.  Patient does maintain good medication compliance. Most recent A1c was 6.8% (prev 6.9%) which is exceeding goal of less than 6.5%. Weight has remained unchanged since last visit.  Patient did bring meter today and is currently testing 1-3 times per day, which is less than what he has tested in the past. Glucose monitoring occurs fasting, prior to meals and as needed.  Hypoglycemia is occassional and as low as 70s. Patient does demonstrate appropriate correction of hypoglycemia.  Patient reports continued, but not worsened, signs and symptoms of neuropathy including numbness/tingling/burning.  Some darkening of skin on lower legs, no change to toes, but does have increased tingling and numbness in toes when glucose is above goal.  No signs of infection at this time.  Patient is up to date on eye exam but not on dental exam.      Lifestyle Assessment:  Diet - Patient and his wife continue using Weight Watcher's.  He is not counting points but is eating weight watcher friendly foods including more fresh foods, fruits, and vegetables.  He does not wish to make any additional changes at this time.   Exercise - Pt is on his feet and walking 8  hours per day on the job.  Pt and his wife travel to [place] regularly and enjoy outdoor activities such as canoeing and walking their neighborhood and [place]. He is attempting to stay active and tracks steps using smartphone app.  He has recently moved to an apartment complex and will no longer be doing yardwork for exercise.  In exchange he and his wife would like to start using the apartment complex fitness facility at least once weekly.    Plan and Goals: 1)  Attempt to further reduce A1c to <6.5 2)  Continue to improve diet, monitor portion sizes and continue to make healthy dietary choices 3)  Continue to stay active throughout the summer months 4)  Follow-up in 3 months on Wednesday November 30th @ 2:15 pm  Great to see you today!  Orlin Hilding, PharmD Link to [facility]  [phone]

## 2015-07-22 NOTE — Progress Notes (Signed)
ATTENDING PHYSICIAN NOTE: I have reviewed the chart and agree with the plan as detailed above. Artrice Kraker MD Pager [phone]  

## 2015-10-14 ENCOUNTER — Ambulatory Visit (INDEPENDENT_AMBULATORY_CARE_PROVIDER_SITE_OTHER): Payer: Self-pay | Admitting: Family Medicine

## 2015-10-14 ENCOUNTER — Encounter: Payer: Self-pay | Admitting: Pharmacist

## 2015-10-14 VITALS — BP 126/72 | Wt 214.0 lb

## 2015-10-14 DIAGNOSIS — E108 Type 1 diabetes mellitus with unspecified complications: Secondary | ICD-10-CM

## 2015-10-14 NOTE — Patient Instructions (Signed)
1)  Attempt to reduce A1c to less than 7 2)  Continue to maintain dietary changes, continue to work toward improving portion control.   3)  Attempt to use fitness center at least once weekly for 30 minutes 4)  Test glucose prior to each meal and as needed.  5)  Follow-up in 3 months on 01/06/16 @ 2:15 pm   Great to see you today!

## 2015-10-14 NOTE — Progress Notes (Signed)
Subjective:  Patient is a 64 yo male with type 1 diabetes who presents today for 3 month follow-up as part of the employer-sponsored Link to Wellness program. Current diabetes regimen includes Humalog via pump, Invokana, and Metformin. Patient also continues on daily ASA and ACEi. Patient is not on a statin at this time due to intolerance.  Most recent MD follow-up was last week with Nathan Ortiz, CDE.  Three month follow-up with Nathan Ortiz in Feb 2017, and 6 mo follow-up with Nathan Ortiz, CDE in May 2017.  No med changes or major health changes at this time.   Diabetes Assessment:  No changes to diabetes regimen at this time.  Patient does maintain good medication compliance. Most recent A1c was 7.1% (prev 6.8%) which is exceeding goal of less than 6.5%. Weight has remained unchanged since last visit.  Patient did bring meter today and is currently testing 1-3 times per day, but is making an effort to improve compliance with this. Glucose monitoring currently occurs fasting, prior to some meals and as needed.  However, patient has set a goal to test fasting, and prior to each meal and as needed, at least three times daily.  Currently uses Accucheck meter, but is interested in switching to [employer] glucometer.  This is not covered by insurance, but patient is interested in trying this through [employer].  Hypoglycemia is rare and as low as 80s. Patient does demonstrate appropriate correction of hypoglycemia.  Patient reports continued, but not worsened, signs and symptoms of neuropathy including numbness/tingling/burning.  No signs of infection at this time.  Patient is up to date on eye exam but not on dental exam.  He currently wears dentures on top and does not currently see a dentist for bottom.     Lifestyle Assessment:  Diet - Patient and his wife continue using Weight Watcher's. They do not use point system, but do adhere to weight watcher recipes and portion control.  Controlling  portion sizes by purchasing pre-measured items such as rice servings for two people (uncle bens rice).  Also preparing less food, and therefore eating less.  Attempted to make healthy choices during thanksgiving and will do so for Christmas as well.    Exercise - No routine exercise outside of work.  Pt is on his feet and walking 8 hours per day on the job.  He does have access to a fitness center at his apartment complex, but has used it only a few times.  Pt has agreed to set a goal of using fitness center once weekly.    Plan and Goals: 1)  Attempt to reduce A1c to less than 7 2)  Continue to maintain dietary changes, continue to work toward improving portion control.   3)  Attempt to use fitness center at least once weekly for 30 minutes 4)  Test glucose prior to each meal and as needed.  5)  Follow-up in 3 months on 01/06/16 @ 2:15 pm  Great to see you today!    Nathan Ortiz, PharmD [facility]  [phone]

## 2015-10-19 ENCOUNTER — Other Ambulatory Visit: Payer: Self-pay | Admitting: Gastroenterology

## 2015-10-22 NOTE — Progress Notes (Signed)
I have reviewed this pharmacist's note and agree  

## 2015-12-03 DIAGNOSIS — H5213 Myopia, bilateral: Secondary | ICD-10-CM | POA: Diagnosis not present

## 2015-12-03 DIAGNOSIS — H2513 Age-related nuclear cataract, bilateral: Secondary | ICD-10-CM | POA: Diagnosis not present

## 2015-12-03 DIAGNOSIS — H52223 Regular astigmatism, bilateral: Secondary | ICD-10-CM | POA: Diagnosis not present

## 2015-12-03 DIAGNOSIS — E1065 Type 1 diabetes mellitus with hyperglycemia: Secondary | ICD-10-CM | POA: Diagnosis not present

## 2015-12-03 DIAGNOSIS — H524 Presbyopia: Secondary | ICD-10-CM | POA: Diagnosis not present

## 2015-12-30 DIAGNOSIS — E1149 Type 2 diabetes mellitus with other diabetic neurological complication: Secondary | ICD-10-CM | POA: Diagnosis not present

## 2015-12-30 DIAGNOSIS — F5221 Male erectile disorder: Secondary | ICD-10-CM | POA: Diagnosis not present

## 2015-12-30 DIAGNOSIS — Z6828 Body mass index (BMI) 28.0-28.9, adult: Secondary | ICD-10-CM | POA: Diagnosis not present

## 2015-12-30 DIAGNOSIS — Z794 Long term (current) use of insulin: Secondary | ICD-10-CM | POA: Diagnosis not present

## 2015-12-30 DIAGNOSIS — I10 Essential (primary) hypertension: Secondary | ICD-10-CM | POA: Diagnosis not present

## 2015-12-30 DIAGNOSIS — E784 Other hyperlipidemia: Secondary | ICD-10-CM | POA: Diagnosis not present

## 2015-12-30 DIAGNOSIS — G629 Polyneuropathy, unspecified: Secondary | ICD-10-CM | POA: Diagnosis not present

## 2015-12-30 DIAGNOSIS — Z1389 Encounter for screening for other disorder: Secondary | ICD-10-CM | POA: Diagnosis not present

## 2016-01-06 ENCOUNTER — Ambulatory Visit: Payer: Self-pay | Admitting: Pharmacist

## 2016-01-13 ENCOUNTER — Ambulatory Visit (INDEPENDENT_AMBULATORY_CARE_PROVIDER_SITE_OTHER): Payer: Self-pay | Admitting: Family Medicine

## 2016-01-13 VITALS — BP 148/74 | Wt 216.0 lb

## 2016-01-13 DIAGNOSIS — E118 Type 2 diabetes mellitus with unspecified complications: Secondary | ICD-10-CM

## 2016-01-13 DIAGNOSIS — Z794 Long term (current) use of insulin: Secondary | ICD-10-CM

## 2016-01-13 MED FILL — VIAGRA 100 MG TABLET: 100 | 90 days supply | Qty: 18 | Fill #1

## 2016-01-13 NOTE — Patient Instructions (Signed)
1)  Attempt to acheive reduced A1c at next MD appt 2)  Continue to maintain dietary changes made after the new year  3)  Attempt to use fitness center at least once weekly for 30 minutes 4)  Improve glucose testing, test fasting and with every meal 5)  Follow-up with [facility] on Wednesday July 12th @ 2:15 pm  Great to see you today!

## 2016-01-13 NOTE — Progress Notes (Signed)
Subjective:  Patient is a 65 yo male with type 1 diabetes who presents today for 3 month follow-up as part of the employer-sponsored [facility] program. Current diabetes regimen includes Humalog via pump, Invokana, and Metformin. Patient also continues on daily ASA and ACEi. Patient is not on a statin at this time due to intolerance.  Most recent MD follow-up was Feb 2017 with Nathan Ortiz.  He will follow-up next with Nathan Ortiz, CDE in May and again with Nathan Ortiz in August 2017.  No med changes or major health changes at this time.  Also, of note, patient had colonoscopy Dec 2016.    Diabetes Assessment:    No changes to diabetes regimen at this time.  Patient does maintain good medication compliance. Most recent A1c was 7.8% (prev 7.1%) which is elevated and exceeding goal of less than 6.5%. Weight has remained unchanged since last visit.  Patient did bring meter today and is currently testing 1-3 times per day, but is making an effort to improve compliance with this. Glucose monitoring currently occurs fasting, prior to some meals and as needed.  However, patient has set a goal to test fasting, and prior to each meal and as needed, at least three times daily.  Currently uses Accucheck meter, but did purchase a Dario meter and has used this on occasion; however, the [employer] does not communicate with pump.  Hypoglycemia is rare, but patient does demonstrate appropriate correction when needed.  Patient reports continued, but not worsened, signs and symptoms of neuropathy including numbness/tingling/burning.  No signs of infection at this time.  Patient is up to date on eye exam but not on dental exam.  He currently wears dentures on top and does not currently see a dentist for bottom.    7d - 149 14d - 136 30d - 142   Lifestyle Assessment:  Diet - Diet deteriorated over the holidays but patient has regained control since the new year.  He and his wife are making healthier options and  attempting to eat more vegetables and lean protein.  Patient enjoyed several christmas treats and "grazed" on cookies and carb heavy snacks but has since been able to greatly limit this.  He also reports portion control being good since the new year.   Exercise - No routine exercise outside of work.  Pt is on his feet and walking 8 hours per day on the job.  He tracks steps and achieves an average of 5 miles per day.  He does have access to a fitness center at his apartment complex, but has used it only a few times. Patient understands the importance of exercise but is not confident he will be able to increase physical activity.     Plan and Goals: 1)  Attempt to acheive reduced A1c at next MD appt 2)  Continue to maintain dietary changes made after the new year  3)  Attempt to use fitness center at least once weekly for 30 minutes 4)  Improve glucose testing, test fasting and with every meal 5)  Follow-up with [facility] on Wednesday July 12th @ 2:15 pm  Great to see you today!    Nathan Ortiz, PharmD [facility]  [phone]

## 2016-02-25 MED FILL — INVOKANA 100 MG TABLET: 100 | 90 days supply | Qty: 90 | Fill #2

## 2016-02-25 MED FILL — LISINOPRIL-HCTZ 20-12.5 MG: 20-12.5 | 90 days supply | Qty: 180 | Fill #1

## 2016-02-25 NOTE — Progress Notes (Signed)
I have reviewed this pharmacist's note and agree  

## 2016-03-29 DIAGNOSIS — E1149 Type 2 diabetes mellitus with other diabetic neurological complication: Secondary | ICD-10-CM | POA: Diagnosis not present

## 2016-03-29 DIAGNOSIS — I10 Essential (primary) hypertension: Secondary | ICD-10-CM | POA: Diagnosis not present

## 2016-03-29 DIAGNOSIS — Z4681 Encounter for fitting and adjustment of insulin pump: Secondary | ICD-10-CM | POA: Diagnosis not present

## 2016-04-19 MED FILL — HumaLOG 100 UNIT/ML SOLN: 100 | 30 days supply | Qty: 80 | Fill #2

## 2016-04-19 MED FILL — metFORMIN HCL 500 MG TABS: 500 | 90 days supply | Qty: 180 | Fill #0

## 2016-04-19 MED FILL — VIAGRA 100 MG TABLET: 100 | 90 days supply | Qty: 18 | Fill #2

## 2016-04-25 DIAGNOSIS — E1065 Type 1 diabetes mellitus with hyperglycemia: Secondary | ICD-10-CM | POA: Diagnosis not present

## 2016-05-25 ENCOUNTER — Ambulatory Visit: Payer: Self-pay | Admitting: Pharmacist
